# Patient Record
Sex: Female | Born: 1995 | Race: Black or African American | Hispanic: No | Marital: Single | Smoking: Never smoker
Health system: Southern US, Community
[De-identification: ages and names within clinical notes are randomized; demographics above are authoritative.]

---

## 2017-04-01 ENCOUNTER — Encounter (HOSPITAL_COMMUNITY): Payer: Self-pay | Admitting: Emergency Medicine

## 2017-04-01 ENCOUNTER — Emergency Department (HOSPITAL_COMMUNITY)
Admission: EM | Admit: 2017-04-01 | Discharge: 2017-04-02 | Disposition: A | Discharge status: Home / Self Care | Payer: BLUE CROSS/BLUE SHIELD | Attending: Emergency Medicine | Admitting: Emergency Medicine

## 2017-04-01 DIAGNOSIS — Y999 Unspecified external cause status: Secondary | ICD-10-CM | POA: Insufficient documentation

## 2017-04-01 DIAGNOSIS — S39012A Strain of muscle, fascia and tendon of lower back, initial encounter: Secondary | ICD-10-CM | POA: Insufficient documentation

## 2017-04-01 DIAGNOSIS — Y929 Unspecified place or not applicable: Secondary | ICD-10-CM | POA: Diagnosis not present

## 2017-04-01 DIAGNOSIS — Y9389 Activity, other specified: Secondary | ICD-10-CM | POA: Diagnosis not present

## 2017-04-01 LAB — PREGNANCY, URINE: Preg Test, Ur: NEGATIVE
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
## 2017-04-01 NOTE — ED Notes (Signed)
See EDP assessment 

## 2017-04-02 ENCOUNTER — Emergency Department (HOSPITAL_COMMUNITY): Payer: BLUE CROSS/BLUE SHIELD

## 2017-04-02 DIAGNOSIS — S39012A Strain of muscle, fascia and tendon of lower back, initial encounter: Secondary | ICD-10-CM | POA: Diagnosis not present

## 2017-04-02 MED ORDER — CYCLOBENZAPRINE HCL 10 MG PO TABS: 10.0000 mg | ORAL_TABLET | Freq: Two times a day (BID) | ORAL | 0 refills | Status: AC | PRN

## 2017-04-02 MED ORDER — NAPROXEN 500 MG PO TABS: 500.0000 mg | ORAL_TABLET | Freq: Two times a day (BID) | ORAL | 0 refills | Status: AC

## 2017-04-02 NOTE — ED Notes (Signed)
Pt verbalizes understanding of d/c instructions. Pt received prescriptions. Pt ambulatory at d/c with all belongings.  

## 2017-04-02 NOTE — ED Notes (Signed)
Patient transported to X-ray 

## 2017-04-02 NOTE — Discharge Instructions (Signed)
The x-ray of your lumbar spine was negative for acute abnormality. Please read attached information regarding your condition. Flexeril and naproxen scheduled for the next 2-3 days to help with your pain. Apply heating pad and stretch areas as tolerated. Return to ED for worsening symptoms, additional injuries or falls, numbness in legs, loss of bowel or bladder function, trouble walking.
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
